# Patient Record
Sex: Male | Born: 2000 | Race: White | Hispanic: No | Marital: Single | Smoking: Never smoker
Health system: Southern US, Community
[De-identification: ages and names within clinical notes are randomized; demographics above are authoritative.]

## PROBLEM LIST (undated history)

## (undated) DIAGNOSIS — Z8614 Personal history of Methicillin resistant Staphylococcus aureus infection: Secondary | ICD-10-CM

---
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
## 2020-03-31 DIAGNOSIS — Z20822 Contact with and (suspected) exposure to covid-19: Secondary | ICD-10-CM | POA: Diagnosis not present

## 2020-08-07 DIAGNOSIS — Z20822 Contact with and (suspected) exposure to covid-19: Secondary | ICD-10-CM | POA: Diagnosis not present

## 2020-08-07 DIAGNOSIS — Z03818 Encounter for observation for suspected exposure to other biological agents ruled out: Secondary | ICD-10-CM | POA: Diagnosis not present

## 2020-09-26 DIAGNOSIS — S0990XA Unspecified injury of head, initial encounter: Secondary | ICD-10-CM | POA: Diagnosis not present

## 2020-09-26 DIAGNOSIS — S098XXA Other specified injuries of head, initial encounter: Secondary | ICD-10-CM | POA: Diagnosis not present

## 2023-01-28 ENCOUNTER — Other Ambulatory Visit (HOSPITAL_BASED_OUTPATIENT_CLINIC_OR_DEPARTMENT_OTHER): Payer: Self-pay

## 2023-01-28 DIAGNOSIS — J3081 Allergic rhinitis due to animal (cat) (dog) hair and dander: Secondary | ICD-10-CM | POA: Diagnosis not present

## 2023-01-28 DIAGNOSIS — J3089 Other allergic rhinitis: Secondary | ICD-10-CM | POA: Diagnosis not present

## 2023-01-28 DIAGNOSIS — J301 Allergic rhinitis due to pollen: Secondary | ICD-10-CM | POA: Diagnosis not present

## 2023-01-28 DIAGNOSIS — L501 Idiopathic urticaria: Secondary | ICD-10-CM | POA: Diagnosis not present

## 2023-01-28 MED ORDER — MOMETASONE FUROATE 0.1 % EX CREA
TOPICAL_CREAM | CUTANEOUS | 1 refills | Status: AC
  Filled 2023-01-28: qty 45, 30d supply, fill #0

## 2023-01-28 MED ORDER — FLUTICASONE PROPIONATE 50 MCG/ACT NA SUSP
1.0000 | Freq: Every day | NASAL | 3 refills | Status: AC
  Filled 2023-01-28: qty 16, 30d supply, fill #0

## 2023-01-28 MED ORDER — CETIRIZINE HCL 10 MG PO TABS
10.0000 mg | ORAL_TABLET | Freq: Every day | ORAL | 3 refills | Status: AC
  Filled 2023-01-28: qty 30, 30d supply, fill #0

## 2023-02-11 ENCOUNTER — Other Ambulatory Visit (HOSPITAL_BASED_OUTPATIENT_CLINIC_OR_DEPARTMENT_OTHER): Payer: Self-pay

## 2024-01-23 ENCOUNTER — Other Ambulatory Visit (HOSPITAL_BASED_OUTPATIENT_CLINIC_OR_DEPARTMENT_OTHER): Payer: Self-pay

## 2024-01-23 DIAGNOSIS — M9271 Juvenile osteochondrosis of metatarsus, right foot: Secondary | ICD-10-CM | POA: Diagnosis not present

## 2024-01-23 DIAGNOSIS — M542 Cervicalgia: Secondary | ICD-10-CM | POA: Diagnosis not present

## 2024-01-23 MED ORDER — MELOXICAM 15 MG PO TABS
15.0000 mg | ORAL_TABLET | Freq: Every day | ORAL | 0 refills | Status: AC
  Filled 2024-01-23: qty 30, 30d supply, fill #0

## 2024-01-23 MED ORDER — METHOCARBAMOL 500 MG PO TABS
500.0000 mg | ORAL_TABLET | Freq: Every day | ORAL | 0 refills | Status: AC
  Filled 2024-01-23: qty 30, 30d supply, fill #0

## 2024-02-03 ENCOUNTER — Other Ambulatory Visit (HOSPITAL_BASED_OUTPATIENT_CLINIC_OR_DEPARTMENT_OTHER): Payer: Self-pay
